# Patient Record
Sex: Female | Born: 1985 | Hispanic: Yes | Marital: Single
Health system: Southern US, Community
[De-identification: ages and names within clinical notes are randomized; demographics above are authoritative.]

---

## 2006-01-10 ENCOUNTER — Emergency Department: Payer: Self-pay | Admitting: Emergency Medicine

## 2009-09-12 ENCOUNTER — Ambulatory Visit: Payer: Self-pay | Admitting: Unknown Physician Specialty

## 2010-11-18 IMAGING — RF DG HYSTEROSALPINOGRAM W/ INJ
1 series · 4 of 4 positions shown · non-contrast
Comparison: none

REASON FOR EXAM: INFERTILITY
COMMENTS:

[Series 1: run · 4 of 4 slices shown]
[im 1/4]
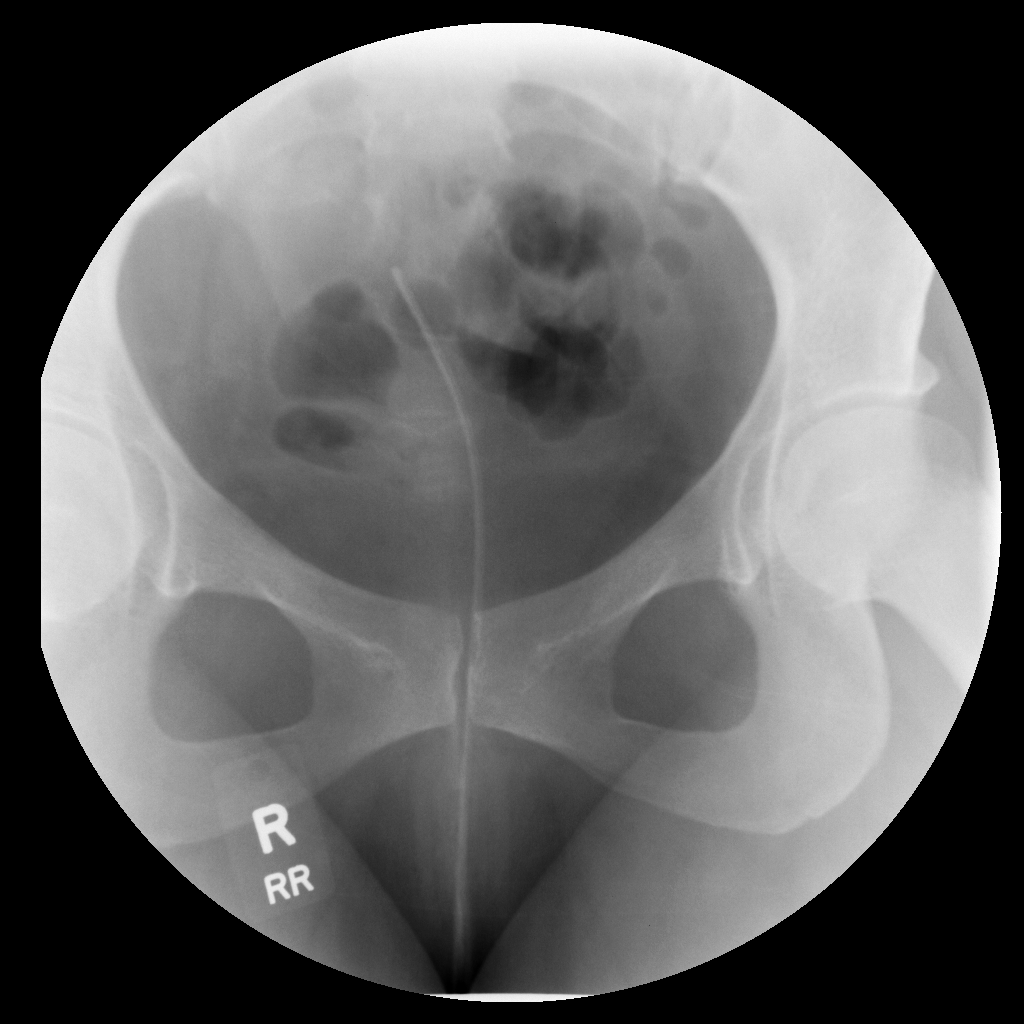
[im 2/4]
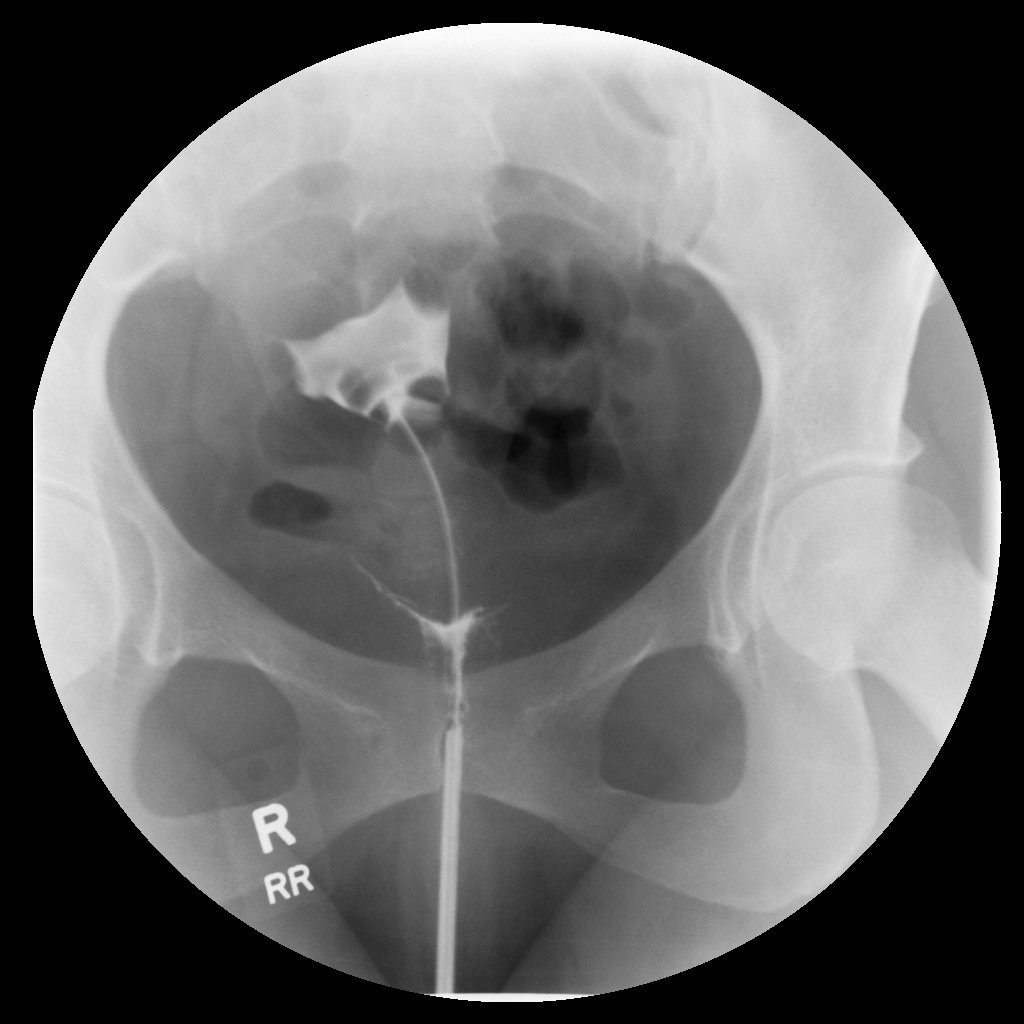
[im 3/4]
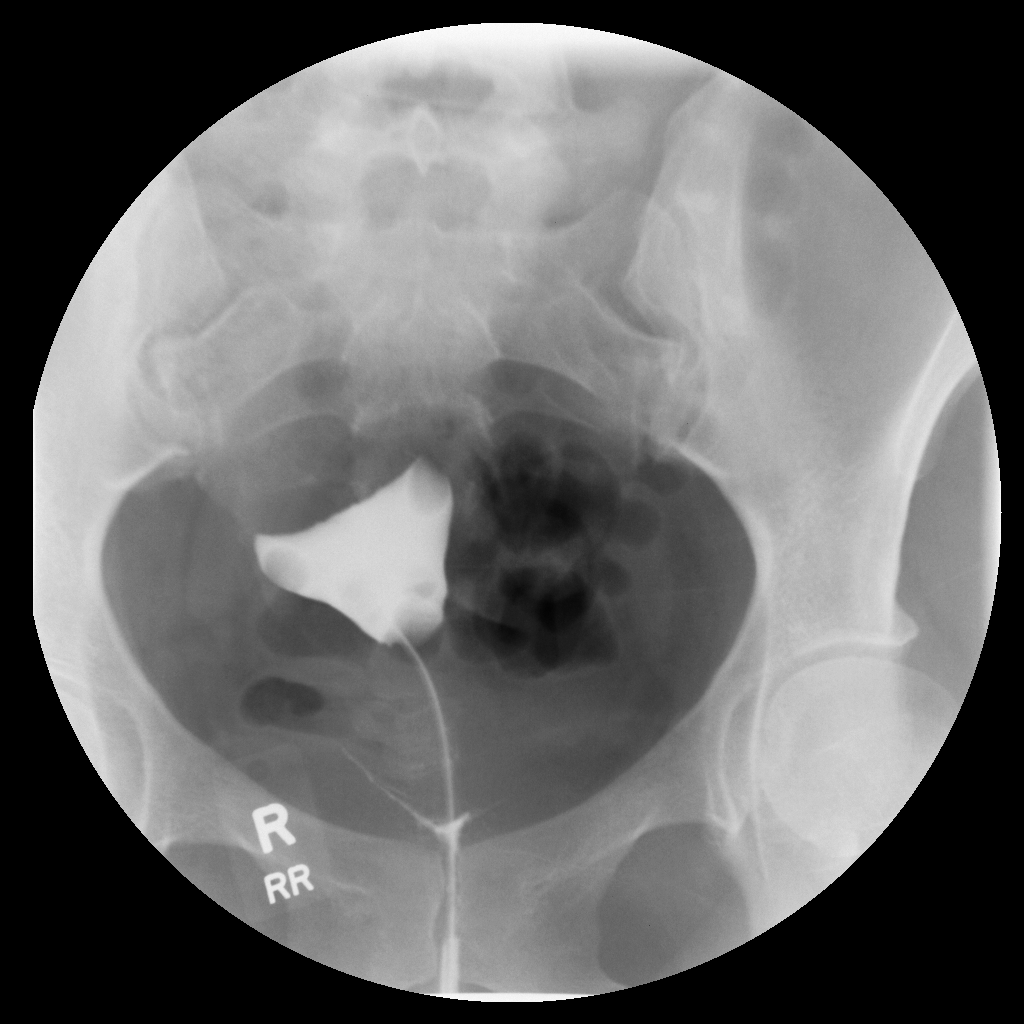
[im 4/4]
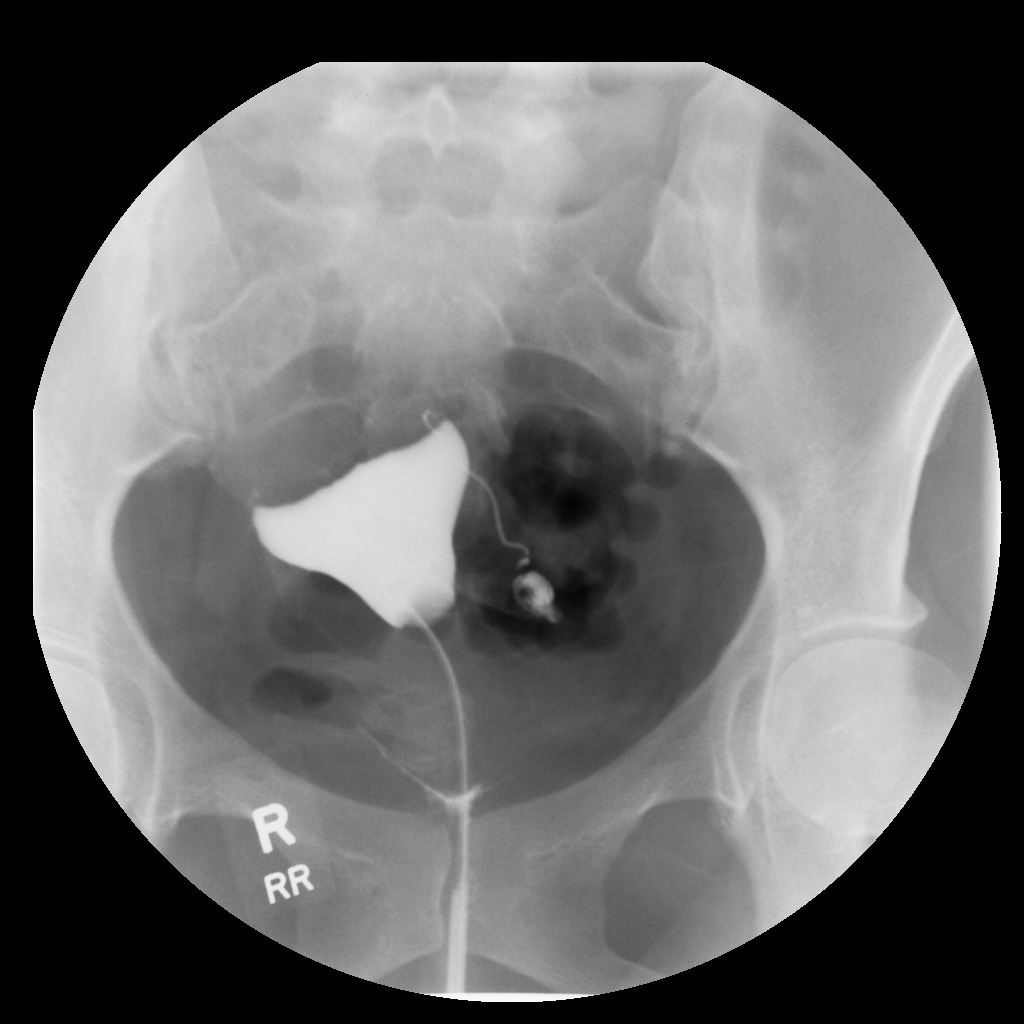

[4 of 4 positions shown; findings below may reference images not displayed]

PROCEDURE:     FL  - FL HYSTEROSALPINGOGRAM  - September 12, 2009  [DATE]

RESULT:     Findings: Fluoroscopy was provided to the GYN service who
performed the hysterosalpingogram.

The uterine cavity is unremarkable in appearance without evidence of focal
filling defect or anomalous appearance.

There was opacification of the left fallopian tube without peritoneal
spillage contrast. The contrast pooled at the tip of the fallopian tube most
concerning for distal occlusion.

There was no opacification of the right fallopian tube most consistent with
occlusion.
IMPRESSION: Please see above.

## 2018-11-25 ENCOUNTER — Other Ambulatory Visit: Payer: Self-pay

## 2018-11-25 DIAGNOSIS — Z20822 Contact with and (suspected) exposure to covid-19: Secondary | ICD-10-CM

## 2018-11-27 LAB — NOVEL CORONAVIRUS, NAA: SARS-CoV-2, NAA: NOT DETECTED

## 2018-12-05 ENCOUNTER — Telehealth: Payer: Self-pay | Admitting: General Practice

## 2018-12-05 NOTE — Telephone Encounter (Signed)
Per patient request, faxed copy of [place] letter and test results to her employer @ [phone].

## 2018-12-05 NOTE — Telephone Encounter (Signed)
Patient requesting generic result note faxed to her employer. Patient has no access to mychart.    Fax:  [employer]  AttnSharyn Lull  # [phone]
